# Patient Record
Sex: Male | Born: 2005 | Race: White | Hispanic: No | Marital: Single | Smoking: Never smoker
Health system: Southern US, Community
[De-identification: ages and names within clinical notes are randomized; demographics above are authoritative.]

---
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
## 2023-03-13 ENCOUNTER — Other Ambulatory Visit: Payer: Self-pay

## 2023-03-13 ENCOUNTER — Encounter (HOSPITAL_COMMUNITY): Payer: Self-pay | Admitting: Emergency Medicine

## 2023-03-13 ENCOUNTER — Emergency Department (HOSPITAL_COMMUNITY)
Admission: EM | Admit: 2023-03-13 | Discharge: 2023-03-13 | Disposition: A | Discharge status: Home / Self Care | Payer: Medicaid Other | Attending: Emergency Medicine | Admitting: Emergency Medicine

## 2023-03-13 DIAGNOSIS — W19XXXA Unspecified fall, initial encounter: Secondary | ICD-10-CM | POA: Diagnosis not present

## 2023-03-13 DIAGNOSIS — R7402 Elevation of levels of lactic acid dehydrogenase (LDH): Secondary | ICD-10-CM | POA: Diagnosis not present

## 2023-03-13 DIAGNOSIS — R000 Tachycardia, unspecified: Secondary | ICD-10-CM | POA: Diagnosis not present

## 2023-03-13 DIAGNOSIS — E873 Alkalosis: Secondary | ICD-10-CM | POA: Insufficient documentation

## 2023-03-13 DIAGNOSIS — R290 Tetany: Secondary | ICD-10-CM | POA: Insufficient documentation

## 2023-03-13 DIAGNOSIS — E876 Hypokalemia: Secondary | ICD-10-CM | POA: Insufficient documentation

## 2023-03-13 DIAGNOSIS — R064 Hyperventilation: Secondary | ICD-10-CM | POA: Insufficient documentation

## 2023-03-13 DIAGNOSIS — R2681 Unsteadiness on feet: Secondary | ICD-10-CM | POA: Diagnosis present

## 2023-03-13 LAB — COMPREHENSIVE METABOLIC PANEL
ALT: 13 U/L (ref 0–44)
AST: 31 U/L (ref 15–41)
Albumin: 5 g/dL (ref 3.5–5.0)
Alkaline Phosphatase: 49 U/L (ref 38–126)
Anion gap: 23 — ABNORMAL HIGH (ref 5–15)
BUN: 10 mg/dL (ref 6–20)
CO2: 12 mmol/L — ABNORMAL LOW (ref 22–32)
Calcium: 9.9 mg/dL (ref 8.9–10.3)
Chloride: 105 mmol/L (ref 98–111)
Creatinine, Ser: 0.97 mg/dL (ref 0.61–1.24)
GFR, Estimated: >60 mL/min (ref >60)
Glucose, Bld: 125 mg/dL — ABNORMAL HIGH (ref 70–99)
Potassium: 3 mmol/L — ABNORMAL LOW (ref 3.5–5.1)
Sodium: 140 mmol/L (ref 135–145)
Total Bilirubin: 1.1 mg/dL (ref <1.2)
Total Protein: 7.9 g/dL (ref 6.5–8.1)

## 2023-03-13 LAB — URINALYSIS, W/ REFLEX TO CULTURE (INFECTION SUSPECTED)
Bacteria, UA: NONE SEEN
Bilirubin Urine: NEGATIVE
Glucose, UA: NEGATIVE mg/dL
Hgb urine dipstick: NEGATIVE
Ketones, ur: 80 mg/dL — AB
Leukocytes,Ua: NEGATIVE
Nitrite: NEGATIVE
Protein, ur: 30 mg/dL — AB
Specific Gravity, Urine: 1.021 (ref 1.005–1.030)
pH: 8 (ref 5.0–8.0)

## 2023-03-13 LAB — RAPID URINE DRUG SCREEN, HOSP PERFORMED
Amphetamines: NOT DETECTED
Barbiturates: NOT DETECTED
Benzodiazepines: NOT DETECTED
Cocaine: NOT DETECTED
Opiates: NOT DETECTED
Tetrahydrocannabinol: NOT DETECTED

## 2023-03-13 LAB — CBC WITH DIFFERENTIAL/PLATELET
Abs Immature Granulocytes: 0.03 10*3/uL (ref 0.00–0.07)
Basophils Absolute: 0 10*3/uL (ref 0.0–0.1)
Basophils Relative: 0 %
Eosinophils Absolute: 0 10*3/uL (ref 0.0–0.5)
Eosinophils Relative: 0 %
HCT: 48.9 % (ref 39.0–52.0)
Hemoglobin: 17.3 g/dL — ABNORMAL HIGH (ref 13.0–17.0)
Immature Granulocytes: 0 %
Lymphocytes Relative: 36 %
Lymphs Abs: 3 10*3/uL (ref 0.7–4.0)
MCH: 28.1 pg (ref 26.0–34.0)
MCHC: 35.4 g/dL (ref 30.0–36.0)
MCV: 79.5 fL — ABNORMAL LOW (ref 80.0–100.0)
Monocytes Absolute: 0.8 10*3/uL (ref 0.1–1.0)
Monocytes Relative: 9 %
Neutro Abs: 4.4 10*3/uL (ref 1.7–7.7)
Neutrophils Relative %: 55 %
Platelets: 277 10*3/uL (ref 150–400)
RBC: 6.15 MIL/uL — ABNORMAL HIGH (ref 4.22–5.81)
RDW: 12.1 % (ref 11.5–15.5)
WBC: 8.2 10*3/uL (ref 4.0–10.5)
nRBC: 0 % (ref 0.0–0.2)

## 2023-03-13 LAB — I-STAT CG4 LACTIC ACID, ED
Lactic Acid, Venous: 2.8 mmol/L (ref 0.5–1.9)
Lactic Acid, Venous: 0.7 mmol/L (ref 0.5–1.9)

## 2023-03-13 LAB — BLOOD GAS, VENOUS
Acid-Base Excess: 0.2 mmol/L (ref 0.0–2.0)
Bicarbonate: 19.6 mmol/L — ABNORMAL LOW (ref 20.0–28.0)
O2 Saturation: 97.1 %
Patient temperature: 37
pCO2, Ven: 20 mm[Hg] — ABNORMAL LOW (ref 44–60)
pH, Ven: 7.6 — ABNORMAL HIGH (ref 7.25–7.43)
pO2, Ven: 68 mm[Hg] — ABNORMAL HIGH (ref 32–45)

## 2023-03-13 LAB — CK: Total CK: 100 U/L (ref 49–397)

## 2023-03-13 LAB — CBG MONITORING, ED: Glucose-Capillary: 137 mg/dL — ABNORMAL HIGH (ref 70–99)

## 2023-03-13 MED ORDER — SODIUM CHLORIDE 0.9 % IV BOLUS
1000.0000 mL | Freq: Once | INTRAVENOUS | Status: AC
  Administered 2023-03-13: 1000 mL via INTRAVENOUS
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
## 2023-03-13 NOTE — ED Provider Notes (Signed)
[place] EMERGENCY DEPARTMENT AT [facility] Provider Note  CSN: [id number] Arrival date & time: 03/13/23 0457  Chief Complaint(s) Cold Exposure  PT BIB EMS for cold exposure, pt was found outside sitting on a bench for an unknown amount of time. Pt reports he has been outside most of the night because of family issues and he does not have stable housing  HPI Jeremy Ferguson is a 17 y.o. male here for COVID exposure.  Patient has unstable housing and has been outside most of the day.  Called EMS this evening due to feeling unsteady on his feet resulting in mechanical fall without significant trauma.  Patient reports that he was too cold and cannot move his feet or hands.  He denied any recent fevers or infections.  No cough or congestion.  He denied any illicit drug use.  Denies any SI/ HI.  The history is provided by the patient and the EMS personnel.    Past Medical History History reviewed. No pertinent past medical history. There are no active problems to display for this patient.  Home Medication(s) Prior to Admission medications   Medication Sig Start Date End Date Taking? Authorizing Provider  ibuprofen (ADVIL,MOTRIN) 100 MG/5ML suspension Take 15.5 mLs (310 mg total) by mouth every 6 (six) hours as needed for fever or mild pain. 07/22/14   Marcellina Millin, MD                                                                                                                                    Allergies Patient has no known allergies.  Review of Systems Review of Systems As noted in HPI  Physical Exam Vital Signs  I have reviewed the triage vital signs BP 119/73 (BP Location: Left Arm)   Pulse (!) 104   Temp 98.6 F (37 C) (Rectal)   Resp (!) 21   SpO2 97%   Physical Exam Vitals reviewed.  Constitutional:      General: He is not in acute distress.    Appearance: He is well-developed. He is not diaphoretic.  HENT:     Head: Normocephalic and atraumatic.      Nose: Nose normal.  Eyes:     General: No scleral icterus.       Right eye: No discharge.        Left eye: No discharge.     Conjunctiva/sclera: Conjunctivae normal.     Pupils: Pupils are equal, round, and reactive to light.  Cardiovascular:     Rate and Rhythm: Regular rhythm. Tachycardia present.     Heart sounds: No murmur heard.    No friction rub. No gallop.  Pulmonary:     Effort: Pulmonary effort is normal. No respiratory distress.     Breath sounds: Normal breath sounds. No stridor. No rales.  Abdominal:     General: There is no distension.     Palpations: Abdomen is soft.  Tenderness: There is no abdominal tenderness.  Musculoskeletal:        General: No tenderness.     Cervical back: Normal range of motion and neck supple.  Skin:    General: Skin is warm and dry.     Findings: No erythema or rash.  Neurological:     Mental Status: He is alert and oriented to person, place, and time.     Motor: Abnormal muscle tone (hands clenched) present. No weakness.     ED Results and Treatments Labs (all labs ordered are listed, but only abnormal results are displayed) Labs Reviewed  CBC WITH DIFFERENTIAL/PLATELET - Abnormal; Notable for the following components:      Result Value   RBC 6.15 (*)    Hemoglobin 17.3 (*)    MCV 79.5 (*)    All other components within normal limits  COMPREHENSIVE METABOLIC PANEL - Abnormal; Notable for the following components:   Potassium 3.0 (*)    CO2 12 (*)    Glucose, Bld 125 (*)    Anion gap 23 (*)    All other components within normal limits  BLOOD GAS, VENOUS - Abnormal; Notable for the following components:   pH, Ven 7.6 (*)    pCO2, Ven 20 (*)    pO2, Ven 68 (*)    Bicarbonate 19.6 (*)    All other components within normal limits  URINALYSIS, W/ REFLEX TO CULTURE (INFECTION SUSPECTED) - Abnormal; Notable for the following components:   Ketones, ur 80 (*)    Protein, ur 30 (*)    All other components within normal limits   CBG MONITORING, ED - Abnormal; Notable for the following components:   Glucose-Capillary 137 (*)    All other components within normal limits  I-STAT CG4 LACTIC ACID, ED - Abnormal; Notable for the following components:   Lactic Acid, Venous 2.8 (*)    All other components within normal limits  CK  RAPID URINE DRUG SCREEN, HOSP PERFORMED  I-STAT CG4 LACTIC ACID, ED                                                                                                                         EKG  EKG Interpretation Date/Time:  Sunday March 13 2023 05:18:18 EST Ventricular Rate:  116 PR Interval:  143 QRS Duration:  84 QT Interval:  308 QTC Calculation: 428 R Axis:   93  Text Interpretation: Sinus tachycardia Borderline right axis deviation Borderline T wave abnormalities No old tracing to compare Confirmed by Drema Pry [phone]) on 03/13/2023 5:36:51 AM       Radiology No results found.  Medications Ordered in ED Medications  sodium chloride 0.9 % bolus 1,000 mL (1,000 mLs Intravenous New Bag/Given 03/13/23 0559)   Procedures Procedures  (including critical care time) Medical Decision Making / ED Course   Medical Decision Making Amount and/or Complexity of Data Reviewed Labs: ordered.     Complexity of Problem:  Co-morbidities/SDOH that complicate the  patient evaluation/care: Unstable housing  Additional history obtained: EMS  Patient's presenting problem/concern, DDX, and MDM listed below: Arm and leg pain 2/2 cold exposure Noted to be tachycardic Will warm. Assess for hypothermia, electrolyte/metabolic derangements, rhabdo, etc  Hospitalization Considered:  yes  Initial Intervention:  IVF Warm blankets    Complexity of Data:   Cardiac Monitoring/EKG: NS tachycardia EKG with sinus tachycardia.  No acute ischemic changes, dysrhythmias or blocks.  Laboratory Tests ordered listed below with my independent interpretation: CBC without leukocytosis or  anemia. CMP with mild hypokalemia 3.0 and decreased bicarb. VBG notable for respiratory alkalosis CK normal UA without evidence of infection UDS negative Lactic acid elevated at 2.8     ED Course:    Assessment, Add'l Intervention, and Reassessment: Tachycardia and carpopedal spasm resolved. No infectious symptoms concerning for sepsis causing elevated lactic acid. Pattern likely related to hyperventilation from physical and emotional stress. Will repeat LA. Anticipate dc Patient care turned over to oncoming provider. Patient case and results discussed in detail; please see their note for further ED managment.       Final Clinical Impression(s) / ED Diagnoses Final diagnoses:  Tachycardia  Carpopedal spasm  Respiratory alkalemia  Hyperventilating    This chart was dictated using voice recognition software.  Despite best efforts to proofread,  errors can occur which can change the documentation meaning.    Nira Conn, MD 03/13/23 [phone]

## 2023-03-13 NOTE — ED Provider Notes (Signed)
Care transferred to me. Patient is feeling normal now. HR in 70s. He was given food/drink and tolerated well.  His lactate has resolved.  As per prior plan with Dr. Eudelia Bunch, will discharge home.   Pricilla Loveless, MD 03/13/23 [phone]

## 2023-03-13 NOTE — Discharge Instructions (Addendum)
Be sure to avoid cold weather if possible.  Make sure you eat and drink appropriately.  If you develop fever, new or worsening symptoms, then return to the ER or call 911.

## 2023-10-21 ENCOUNTER — Encounter (HOSPITAL_COMMUNITY): Payer: Self-pay

## 2023-10-21 ENCOUNTER — Emergency Department (HOSPITAL_COMMUNITY): Admission: EM | Admit: 2023-10-21 | Discharge: 2023-10-21 | Disposition: A | Discharge status: Home / Self Care

## 2023-10-21 ENCOUNTER — Other Ambulatory Visit: Payer: Self-pay

## 2023-10-21 DIAGNOSIS — Z202 Contact with and (suspected) exposure to infections with a predominantly sexual mode of transmission: Secondary | ICD-10-CM | POA: Diagnosis not present

## 2023-10-21 DIAGNOSIS — R300 Dysuria: Secondary | ICD-10-CM | POA: Insufficient documentation

## 2023-10-21 LAB — URINALYSIS, ROUTINE W REFLEX MICROSCOPIC
Bacteria, UA: NONE SEEN
Bilirubin Urine: NEGATIVE
Glucose, UA: NEGATIVE mg/dL
Ketones, ur: NEGATIVE mg/dL
Nitrite: NEGATIVE
Protein, ur: NEGATIVE mg/dL
Specific Gravity, Urine: 1.002 — ABNORMAL LOW (ref 1.005–1.030)
pH: 8 (ref 5.0–8.0)

## 2023-10-21 LAB — HIV ANTIBODY (ROUTINE TESTING W REFLEX): HIV Screen 4th Generation wRfx: NONREACTIVE

## 2023-10-21 MED ORDER — CEFTRIAXONE SODIUM 1 G IJ SOLR: 500.0000 mg | Freq: Once | INTRAMUSCULAR | Status: DC

## 2023-10-21 NOTE — ED Triage Notes (Signed)
 Pt states that it hurts when he urinates x 2 weeks. Pt states that he doesn't know if it is STD related or not. Pt has pain in his bladder after urination.

## 2023-10-21 NOTE — ED Provider Notes (Signed)
 [place] EMERGENCY DEPARTMENT AT [facility] Provider Note   CSN: [id number] Arrival date & time: 10/21/23  1747     Patient presents with: Dysuria   Jeremy Ferguson is a 18 y.o. male.  Patient without medical history presents emergency department concerns of dysuria.  Endorses dysuria ongoing for the last 2 weeks.  States that he is concerned for possible STIs as he endorses new sexual partner within the last month.  Endorses pain primarily towards his bladder after urination.  No reported abnormal urethral discharge.  No reported testicular pain or discomfort.  No rashes in the pelvis/inguinal region.   Dysuria Presenting symptoms: dysuria        Prior to Admission medications   Medication Sig Start Date End Date Taking? Authorizing Provider  ibuprofen  (ADVIL ,MOTRIN ) 100 MG/5ML suspension Take 15.5 mLs (310 mg total) by mouth every 6 (six) hours as needed for fever or mild pain. 07/22/14   Rhae Lye, MD    Allergies: Patient has no known allergies.    Review of Systems  Genitourinary:  Positive for dysuria.  All other systems reviewed and are negative.   Updated Vital Signs BP (!) 123/90 (BP Location: Left Arm)   Pulse 67   Temp 97.8 F (36.6 C) (Oral)   Resp 18   Ht 5' 9 (1.753 m)   Wt 53.1 kg   SpO2 100%   BMI 17.28 kg/m   Physical Exam Vitals and nursing note reviewed.  Constitutional:      General: He is not in acute distress.    Appearance: He is well-developed.  HENT:     Head: Normocephalic and atraumatic.  Eyes:     Conjunctiva/sclera: Conjunctivae normal.  Cardiovascular:     Rate and Rhythm: Normal rate and regular rhythm.     Heart sounds: No murmur heard. Pulmonary:     Effort: Pulmonary effort is normal. No respiratory distress.     Breath sounds: Normal breath sounds.  Abdominal:     Palpations: Abdomen is soft.     Tenderness: There is no abdominal tenderness.  Musculoskeletal:        General: No swelling.     Cervical  back: Neck supple.  Skin:    General: Skin is warm and dry.     Capillary Refill: Capillary refill takes less than 2 seconds.  Neurological:     Mental Status: He is alert.  Psychiatric:        Mood and Affect: Mood normal.     (all labs ordered are listed, but only abnormal results are displayed) Labs Reviewed  URINALYSIS, ROUTINE W REFLEX MICROSCOPIC - Abnormal; Notable for the following components:      Result Value   Color, Urine COLORLESS (*)    Specific Gravity, Urine 1.002 (*)    Hgb urine dipstick SMALL (*)    Leukocytes,Ua MODERATE (*)    All other components within normal limits  HIV ANTIBODY (ROUTINE TESTING W REFLEX)  GC/CHLAMYDIA PROBE AMP ([place]) NOT AT [facility]    EKG: None  Radiology: No results found.   Procedures   Medications Ordered in the ED - No data to display                                   Medical Decision Making Amount and/or Complexity of Data Reviewed Labs: ordered.  Risk Prescription drug management.   This patient presents to the ED  for concern of dysuria.  Differential diagnosis includes UTI, STI, interstitial cystitis, urethritis   Lab Tests:  I Ordered, and personally interpreted labs.  The pertinent results include: UA with no bacteria but some leukocytes seen, gonorrhea/chlamydia pending, HIV pending   Problem List / ED Course:  Patient presents emergency department for concerns of dysuria.  He reports this been ongoing for the last 2 weeks and typically notices the symptoms towards the end of his urine stream.  States that he is sexually active with 1 male partner.  States his partner is relatively new within the last month.  No prior STI testing.  Denies any urethral discharge or drainage.  No testicular symptoms. On exam, no concerning findings. Abdomen is soft and non-tender. Deferring GU exam for now per patient request. Ordered UA, GC/chlamydia, and HIV as part of dysuria/STI workup. UA negative. Doubt  uncomplicated UTI. GC/chlamydia will take days to result and informed patient of this. Advised initiated treatment given recent onset of symptoms coinciding with new partner but patient refusing at this time and would prefer for results to come back before starting treatment. Encouraged strict return precautions. Otherwise stable for outpatient follow up and discharged home.   Social Determinants of Health:  None  Final diagnoses:  Possible exposure to STI  Dysuria    ED Discharge Orders     None          Cecily Legrand LABOR, PA-C 10/21/23 1929    Neysa Caron PARAS, DO 10/21/23 2334

## 2023-10-21 NOTE — Discharge Instructions (Signed)
 You were seen in there ER today for concerns of painful urination. Your basic urine test did not show signs of infection. Your STI testing is currently pending and if for any reason these results are positive, you will receive a phone call on how to get treated. For any concerns of new or worsening symptoms, return to the ER.

## 2023-10-24 LAB — GC/CHLAMYDIA PROBE AMP (~~LOC~~) NOT AT ARMC
Chlamydia: POSITIVE — AB
Comment: NEGATIVE
Comment: NORMAL
Neisseria Gonorrhea: NEGATIVE

## 2023-10-27 ENCOUNTER — Ambulatory Visit (HOSPITAL_COMMUNITY): Payer: Self-pay
